# Patient Record
Sex: Male | Born: 2005 | Race: White | Hispanic: No | Marital: Single | Smoking: Never smoker
Health system: Southern US, Community
[De-identification: ages and names within clinical notes are randomized; demographics above are authoritative.]

---
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
## 2024-03-23 ENCOUNTER — Encounter: Payer: Self-pay | Admitting: Family Medicine

## 2024-03-23 ENCOUNTER — Ambulatory Visit: Payer: Self-pay | Admitting: Family Medicine

## 2024-03-23 VITALS — BP 122/78 | HR 65 | Temp 97.7°F | Ht 69.24 in | Wt 144.0 lb

## 2024-03-23 DIAGNOSIS — Z0000 Encounter for general adult medical examination without abnormal findings: Secondary | ICD-10-CM

## 2024-03-23 DIAGNOSIS — Z13220 Encounter for screening for lipoid disorders: Secondary | ICD-10-CM

## 2024-03-23 DIAGNOSIS — Z131 Encounter for screening for diabetes mellitus: Secondary | ICD-10-CM

## 2024-03-23 NOTE — Progress Notes (Signed)
   Subjective:    Patient ID: Rickey [name], male    DOB: 2006-07-30, 18 y.o.   MRN: [id number]  HPI The patient comes in today for a wellness visit.    A review of their health history was completed.  A review of medications was also completed.  Any needed refills; N/A  Eating habits: good diet habiets  Falls/  MVA accidents in past few months: N/A  Regular exercise: stays active  Specialist pt sees on regular basis: N/A  Preventative health issues were discussed.   Additional concerns: none      Review of Systems     Objective:   Physical Exam General-in no acute distress Eyes-no discharge Lungs-respiratory rate normal, CTA CV-no murmurs,RRR Extremities skin warm dry no edema Neuro grossly normal Behavior normal, alert  We did discuss safety Discussed hearing protection and work places Discussed healthy eating Patient does not smoke or drink  Screening lab work indicated by his work form     Assessment & Plan:  Adult wellness-complete.wellness physical was conducted today. Importance of diet and exercise were discussed in detail.  Importance of stress reduction and healthy living were discussed.  In addition to this a discussion regarding safety was also covered.  We also reviewed over immunizations and gave recommendations regarding current immunization needed for age.   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

## 2024-03-24 ENCOUNTER — Encounter: Payer: Self-pay | Admitting: Family Medicine

## 2024-03-24 LAB — LIPID PANEL
Chol/HDL Ratio: 2.8 ratio (ref 0.0–5.0)
Cholesterol, Total: 133 mg/dL (ref 100–169)
HDL: 48 mg/dL (ref >39)
LDL Chol Calc (NIH): 67 mg/dL (ref 0–109)
Triglycerides: 97 mg/dL — ABNORMAL HIGH (ref 0–89)
VLDL Cholesterol Cal: 18 mg/dL (ref 5–40)

## 2024-03-24 LAB — GLUCOSE, RANDOM: Glucose: 85 mg/dL (ref 70–99)
